# Patient Record
Sex: Female | Born: 1945 | Race: White | Hispanic: No | Marital: Married | Smoking: Never smoker
Health system: Southern US, Community
[De-identification: ages and names within clinical notes are randomized; demographics above are authoritative.]

## PROBLEM LIST (undated history)

## (undated) DIAGNOSIS — C801 Malignant (primary) neoplasm, unspecified: Secondary | ICD-10-CM

## (undated) DIAGNOSIS — I10 Essential (primary) hypertension: Secondary | ICD-10-CM

## (undated) DIAGNOSIS — E119 Type 2 diabetes mellitus without complications: Secondary | ICD-10-CM

## (undated) HISTORY — PX: BACK SURGERY: SHX140

## (undated) HISTORY — PX: HAND SURGERY: SHX662

## (undated) HISTORY — PX: TONSILLECTOMY: SUR1361

## (undated) HISTORY — PX: SHOULDER SURGERY: SHX246

## (undated) HISTORY — PX: BREAST SURGERY: SHX581

## (undated) HISTORY — DX: Essential (primary) hypertension: I10

## (undated) HISTORY — PX: DILATION AND CURETTAGE, DIAGNOSTIC / THERAPEUTIC: SUR384

---

## 2012-02-01 ENCOUNTER — Ambulatory Visit: Payer: Self-pay | Admitting: Internal Medicine

## 2017-07-20 DIAGNOSIS — R0600 Dyspnea, unspecified: Secondary | ICD-10-CM | POA: Insufficient documentation

## 2017-07-20 DIAGNOSIS — R0609 Other forms of dyspnea: Secondary | ICD-10-CM | POA: Insufficient documentation

## 2017-07-20 DIAGNOSIS — R011 Cardiac murmur, unspecified: Secondary | ICD-10-CM | POA: Insufficient documentation

## 2017-07-20 DIAGNOSIS — I10 Essential (primary) hypertension: Secondary | ICD-10-CM | POA: Insufficient documentation

## 2018-03-15 DIAGNOSIS — Z853 Personal history of malignant neoplasm of breast: Secondary | ICD-10-CM | POA: Insufficient documentation

## 2018-06-12 ENCOUNTER — Emergency Department
Admission: EM | Admit: 2018-06-12 | Discharge: 2018-06-12 | Disposition: A | Discharge status: Home / Self Care | Payer: Medicare Other | Attending: Emergency Medicine | Admitting: Emergency Medicine

## 2018-06-12 ENCOUNTER — Encounter: Payer: Self-pay | Admitting: Emergency Medicine

## 2018-06-12 ENCOUNTER — Emergency Department: Payer: Medicare Other

## 2018-06-12 ENCOUNTER — Other Ambulatory Visit: Payer: Self-pay

## 2018-06-12 DIAGNOSIS — S2231XA Fracture of one rib, right side, initial encounter for closed fracture: Secondary | ICD-10-CM | POA: Insufficient documentation

## 2018-06-12 DIAGNOSIS — W01198A Fall on same level from slipping, tripping and stumbling with subsequent striking against other object, initial encounter: Secondary | ICD-10-CM | POA: Diagnosis not present

## 2018-06-12 DIAGNOSIS — Y999 Unspecified external cause status: Secondary | ICD-10-CM | POA: Diagnosis not present

## 2018-06-12 DIAGNOSIS — Y929 Unspecified place or not applicable: Secondary | ICD-10-CM | POA: Diagnosis not present

## 2018-06-12 DIAGNOSIS — Y939 Activity, unspecified: Secondary | ICD-10-CM | POA: Diagnosis not present

## 2018-06-12 DIAGNOSIS — E119 Type 2 diabetes mellitus without complications: Secondary | ICD-10-CM | POA: Diagnosis not present

## 2018-06-12 HISTORY — DX: Malignant (primary) neoplasm, unspecified: C80.1

## 2018-06-12 HISTORY — DX: Type 2 diabetes mellitus without complications: E11.9

## 2018-06-12 MED ORDER — TRAMADOL HCL 50 MG PO TABS: 50.0000 mg | ORAL_TABLET | Freq: Four times a day (QID) | ORAL | 0 refills | Status: AC | PRN

## 2018-06-12 NOTE — ED Provider Notes (Signed)
[facility] Emergency Department Provider Note  ____________________________________________   None    (approximate)  I have reviewed the triage vital signs and the nursing notes.   HISTORY  Chief Complaint Fall   HPI Emily Ortega is a 72 y.o. female  presents  to the ED after falling this morning and landing on a box.  Patient has continued to have right lateral rib pain with deep inspiration.  She denies any head injury or loss of consciousness.  She currently is taking meloxicam for a rotator cuff injury.  She denies any previous injuries to her ribs.  Patient has continued to be ambulatory since her accident.  She rates her pain as a 7 out of 10.   Past Medical History:  Diagnosis Date  . Cancer ([place])   . Diabetes mellitus without complication ([place])     There are no active problems to display for this patient.   Past Surgical History:  Procedure Laterality Date  . BREAST SURGERY    . HAND SURGERY    . SHOULDER SURGERY    . TONSILLECTOMY      Prior to Admission medications   Medication Sig Start Date End Date Taking? Authorizing Provider  traMADol (ULTRAM) 50 MG tablet Take 1 tablet (50 mg total) by mouth every 6 (six) hours as needed. 06/12/18   Johnn Hai, PA-C    Allergies Avelox [moxifloxacin hcl in nacl]  No family history on file.  Social History Social History   Tobacco Use  . Smoking status: Never Smoker  . Smokeless tobacco: Never Used  Substance Use Topics  . Alcohol use: Not on file  . Drug use: Not on file    Review of Systems Constitutional: No fever/chills Eyes: No visual changes. ENT: No trauma. Cardiovascular: Denies chest pain. Respiratory: Denies shortness of breath.  Positive right chest wall pain. Gastrointestinal: No abdominal pain.  No nausea, no vomiting.   Musculoskeletal: Negative for back pain. Skin: Negative for rash. Neurological: Negative for headaches, focal weakness or  numbness. ___________________________________________   PHYSICAL EXAM:  VITAL SIGNS: ED Triage Vitals  Enc Vitals Group     BP 06/12/18 1016 (!) 148/73     Pulse Rate 06/12/18 1016 75     Resp 06/12/18 1016 16     Temp 06/12/18 1016 98.1 F (36.7 C)     Temp Source 06/12/18 1016 Oral     SpO2 06/12/18 1016 98 %     Weight 06/12/18 1002 180 lb (81.6 kg)     Height --      Head Circumference --      Peak Flow --      Pain Score 06/12/18 1002 7     Pain Loc --      Pain Edu? --      Excl. in [place]? --    Constitutional: Alert and oriented. Well appearing and in no acute distress. Eyes: Conjunctivae are normal. PERRL. EOMI. Head: Atraumatic. Nose: No congestion/rhinnorhea. Neck: No stridor.   Cardiovascular: Normal rate, regular rhythm. Grossly normal heart sounds.  Good peripheral circulation. Respiratory: Normal respiratory effort.  No retractions. Lungs CTAB.  On examination of the right ribs there is no gross deformity and no ecchymosis or soft tissue abrasions noted.  It is moderately tender on palpation of the right lateral ribs.  No crepitus is appreciated. Gastrointestinal: Soft and nontender. No distention.  Musculoskeletal: Moves upper and lower extremities without any difficulty. Neurologic:  Normal speech and language.  No gross focal neurologic deficits are appreciated.  Skin:  Skin is warm, dry and intact.  No ecchymosis, erythema or abrasions were noted. Psychiatric: Mood and affect are normal. Speech and behavior are normal.  ____________________________________________   LABS (all labs ordered are listed, but only abnormal results are displayed)  Labs Reviewed - No data to display ____________________________________________  RADIOLOGY  Official radiology report(s): Dg Ribs Unilateral W/chest Right  Result Date: 06/12/2018 CLINICAL DATA:  Recent fall with right-sided chest pain, initial encounter EXAM: RIGHT RIBS AND CHEST - 3+ VIEW COMPARISON:  02/01/2012  FINDINGS: Cardiac shadows within normal limits. The lungs are well aerated bilaterally. No focal infiltrate, effusion or pneumothorax is seen. Calcified granuloma in the left lung base is noted. Mild irregularity is noted along the anterior aspect of the right fifth rib which may represent an undisplaced fracture. No other definitive fractures are seen. IMPRESSION: Changes suspicious for undisplaced fracture of the right fifth rib. Electronically Signed   By: Inez Catalina M.D.   On: 06/12/2018 11:57    ____________________________________________   PROCEDURES  Procedure(s) performed: None  Procedures  Critical Care performed: No  ____________________________________________   INITIAL IMPRESSION / ASSESSMENT AND PLAN / ED COURSE  As part of my medical decision making, I reviewed the following data within the electronic MEDICAL RECORD NUMBER Notes from prior ED visits and [place] Controlled Substance Database  Patient presents to the ED after falling this morning and hitting her right ribs on a box that she was carrying.  Patient denies any head injury or loss of consciousness.  She denies any other injuries.  Since her fall she is continued to have pain in her right rib and is exquisitely tender to palpation.  No soft tissue injury is noted.  X-ray confirms that she does have a 5th nondisplaced fracture on the right.  Patient was made aware.  We discussed pain medication.  She will take tramadol for moderate to severe pain.  Currently she is taking meloxicam for a rotator cuff injury and will continue doing so.  She is to follow-up with her PCP if any continued problems or additional pain medication.  ____________________________________________   FINAL CLINICAL IMPRESSION(S) / ED DIAGNOSES  Final diagnoses:  Traumatic closed nondisplaced fracture of one rib, right, initial encounter     ED Discharge Orders         Ordered    traMADol (ULTRAM) 50 MG tablet  Every 6 hours PRN     06/12/18  1237           Note:  This document was prepared using Dragon voice recognition software and may include unintentional dictation errors.    Johnn Hai, PA-C 06/12/18 1246    Earleen Newport, MD 06/12/18 [phone]

## 2018-06-12 NOTE — Discharge Instructions (Addendum)
Follow-up with your primary care provider if any continued problems.  Rib fractures take about 4 to 6 weeks to heal.  Continue to take deep breaths to prevent pneumonia.  You may place a pillow over your rib to help support it if you need to cough.  Continue taking meloxicam for inflammation.  Tylenol as needed for pain.  Tramadol is for moderate pain.  This medication could cause drowsiness.  This tablet is 1 every 6 hours as needed for pain.  Follow-up with your primary care provider if any continued pain medication as needed.

## 2018-06-12 NOTE — ED Notes (Signed)
Pt had mechanical fall this am and fell onto cement landing on RT side, c/o RT rib cage area. PT holding side. Denies any LOC or head injury with fall

## 2018-07-02 ENCOUNTER — Encounter: Payer: Self-pay | Admitting: Family Medicine

## 2018-07-02 ENCOUNTER — Ambulatory Visit (INDEPENDENT_AMBULATORY_CARE_PROVIDER_SITE_OTHER): Payer: Medicare Other | Admitting: Family Medicine

## 2018-07-02 VITALS — BP 154/80 | HR 93 | Ht 64.0 in | Wt 182.0 lb

## 2018-07-02 DIAGNOSIS — Z01419 Encounter for gynecological examination (general) (routine) without abnormal findings: Secondary | ICD-10-CM | POA: Diagnosis not present

## 2018-07-02 NOTE — Progress Notes (Signed)
Had mammo this year and was normal Last Pap was last year and was normal

## 2018-07-02 NOTE — Progress Notes (Signed)
  Subjective:     Emily Ortega is a 72 y.o. female and is here for a comprehensive physical exam. The patient reports no problems. 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 Chemo lead to menopause at age 77. No bleeding now. Retired but keeping her grandkids now. Mother of Emily Ortega. 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 Recently moved here from [place]. Remarried.  The following portions of the patient's history were reviewed and updated as appropriate: allergies, current medications, past family history, past medical history, past social history, past surgical history and problem list.  Review of Systems Pertinent items noted in HPI and remainder of comprehensive ROS otherwise negative.   Objective:    BP (!) 154/80   Pulse 93   Ht 5\' 4"  (1.626 m)   Wt 182 lb (82.6 kg)   BMI 31.24 kg/m  General appearance: alert, cooperative and appears stated age Head: Normocephalic, without obvious abnormality, atraumatic Neck: no adenopathy, supple, symmetrical, trachea midline and thyroid not enlarged, symmetric, no tenderness/mass/nodules Lungs: clear to auscultation bilaterally Breasts: normal appearance, no masses or tenderness Heart: regular rate and rhythm, S1, S2 normal, no murmur, click, rub or gallop Abdomen: soft, non-tender; bowel sounds normal; no masses,  no organomegaly Pelvic: cervix normal in appearance, external genitalia normal, no adnexal masses or tenderness, no cervical motion tenderness, uterus normal size, shape, and consistency and vaginal atrophy Extremities: Homans sign is negative, no sign of DVT Pulses: 2+ and symmetric Skin: Skin color, texture, turgor normal. No rashes or lesions Lymph nodes: Cervical, supraclavicular, and axillary nodes normal. Neurologic: Grossly normal    Assessment:    GYN female exam.      Plan:   Problem List Items Addressed This Visit    None    Visit Diagnoses    Encounter for gynecological examination without  abnormal finding    -  Primary     No need for further pap smears, given age. Likes for GYN to do her pelvic and breast check--last mammogram is normal.  Needs PCP-->given [place] info.  Return in 1 year (on 07/03/2019).    See After Visit Summary for Counseling Recommendations

## 2018-07-02 NOTE — Patient Instructions (Signed)
Preventive Care 70 Years and Older, Female Preventive care refers to lifestyle choices and visits with your health care provider that can promote health and wellness. What does preventive care include?  A yearly physical exam. This is also called an annual well check.  Dental exams once or twice a year.  Routine eye exams. Ask your health care provider how often you should have your eyes checked.  Personal lifestyle choices, including: ? Daily care of your teeth and gums. ? Regular physical activity. ? Eating a healthy diet. ? Avoiding tobacco and drug use. ? Limiting alcohol use. ? Practicing safe sex. ? Taking low-dose aspirin every day. ? Taking vitamin and mineral supplements as recommended by your health care provider. What happens during an annual well check? The services and screenings done by your health care provider during your annual well check will depend on your age, overall health, lifestyle risk factors, and family history of disease. Counseling Your health care provider may ask you questions about your:  Alcohol use.  Tobacco use.  Drug use.  Emotional well-being.  Home and relationship well-being.  Sexual activity.  Eating habits.  History of falls.  Memory and ability to understand (cognition).  Work and work Statistician.  Reproductive health.  Screening You may have the following tests or measurements:  Height, weight, and BMI.  Blood pressure.  Lipid and cholesterol levels. These may be checked every 5 years, or more frequently if you are over 70 years old.  Skin check.  Lung cancer screening. You may have this screening every year starting at age 70 if you have a 30-pack-year history of smoking and currently smoke or have quit within the past 15 years.  Colorectal cancer screening. All adults should have this screening starting at age 70 and continuing until age 70. You will have tests every 1-10 years, depending on your results and the  type of screening test. People at increased risk should start screening at an earlier age. Screening tests may include: ? Guaiac-based fecal occult blood testing. ? Fecal immunochemical test (FIT). ? Stool DNA test. ? Virtual colonoscopy. ? Sigmoidoscopy. During this test, a flexible tube with a tiny camera (sigmoidoscope) is used to examine your rectum and lower colon. The sigmoidoscope is inserted through your anus into your rectum and lower colon. ? Colonoscopy. During this test, a long, thin, flexible tube with a tiny camera (colonoscope) is used to examine your entire colon and rectum.  Hepatitis C blood test.  Hepatitis B blood test.  Sexually transmitted disease (STD) testing.  Diabetes screening. This is done by checking your blood sugar (glucose) after you have not eaten for a while (fasting). You may have this done every 1-3 years.  Bone density scan. This is done to screen for osteoporosis. You may have this done starting at age 70.  Mammogram. This may be done every 1-2 years. Talk to your health care provider about how often you should have regular mammograms. Talk with your health care provider about your test results, treatment options, and if necessary, the need for more tests. Vaccines Your health care provider may recommend certain vaccines, such as:  Influenza vaccine. This is recommended every year.  Tetanus, diphtheria, and acellular pertussis (Tdap, Td) vaccine. You may need a Td booster every 10 years.  Varicella vaccine. You may need this if you have not been vaccinated.  Zoster vaccine. You may need this after age 70.  Measles, mumps, and rubella (MMR) vaccine. You may need at least  one dose of MMR if you were born in 1957 or later. You may also need a second dose.  Pneumococcal 13-valent conjugate (PCV13) vaccine. One dose is recommended after age 70.  Pneumococcal polysaccharide (PPSV23) vaccine. One dose is recommended after age 70.  Meningococcal  vaccine. You may need this if you have certain conditions.  Hepatitis A vaccine. You may need this if you have certain conditions or if you travel or work in places where you may be exposed to hepatitis A.  Hepatitis B vaccine. You may need this if you have certain conditions or if you travel or work in places where you may be exposed to hepatitis B.  Haemophilus influenzae type b (Hib) vaccine. You may need this if you have certain conditions. Talk to your health care provider about which screenings and vaccines you need and how often you need them. This information is not intended to replace advice given to you by your health care provider. Make sure you discuss any questions you have with your health care provider. Document Released: 07/16/2015 Document Revised: 08/09/2017 Document Reviewed: 04/20/2015 Elsevier Interactive Patient Education  2019 [employer].

## 2018-07-04 ENCOUNTER — Encounter: Payer: Self-pay | Admitting: Family Medicine

## 2018-07-04 DIAGNOSIS — E7800 Pure hypercholesterolemia, unspecified: Secondary | ICD-10-CM | POA: Insufficient documentation

## 2018-07-04 DIAGNOSIS — E119 Type 2 diabetes mellitus without complications: Secondary | ICD-10-CM | POA: Insufficient documentation

## 2018-07-04 DIAGNOSIS — M8580 Other specified disorders of bone density and structure, unspecified site: Secondary | ICD-10-CM | POA: Insufficient documentation

## 2018-07-04 DIAGNOSIS — G4733 Obstructive sleep apnea (adult) (pediatric): Secondary | ICD-10-CM | POA: Insufficient documentation

## 2018-07-04 DIAGNOSIS — E559 Vitamin D deficiency, unspecified: Secondary | ICD-10-CM | POA: Insufficient documentation

## 2018-07-04 DIAGNOSIS — F419 Anxiety disorder, unspecified: Secondary | ICD-10-CM | POA: Insufficient documentation

## 2018-07-04 DIAGNOSIS — F325 Major depressive disorder, single episode, in full remission: Secondary | ICD-10-CM | POA: Insufficient documentation

## 2018-07-04 DIAGNOSIS — K219 Gastro-esophageal reflux disease without esophagitis: Secondary | ICD-10-CM | POA: Insufficient documentation

## 2018-07-08 ENCOUNTER — Encounter: Payer: Self-pay | Admitting: Radiology

## 2019-06-11 ENCOUNTER — Encounter: Payer: Self-pay | Admitting: Radiology

## 2020-04-30 ENCOUNTER — Emergency Department: Payer: Medicare Other

## 2020-04-30 ENCOUNTER — Encounter: Payer: Self-pay | Admitting: Emergency Medicine

## 2020-04-30 ENCOUNTER — Other Ambulatory Visit: Payer: Self-pay

## 2020-04-30 ENCOUNTER — Emergency Department
Admission: EM | Admit: 2020-04-30 | Discharge: 2020-04-30 | Disposition: A | Discharge status: Home / Self Care | Payer: Medicare Other | Attending: Emergency Medicine | Admitting: Emergency Medicine

## 2020-04-30 DIAGNOSIS — Z79899 Other long term (current) drug therapy: Secondary | ICD-10-CM | POA: Insufficient documentation

## 2020-04-30 DIAGNOSIS — E119 Type 2 diabetes mellitus without complications: Secondary | ICD-10-CM | POA: Diagnosis not present

## 2020-04-30 DIAGNOSIS — Z7984 Long term (current) use of oral hypoglycemic drugs: Secondary | ICD-10-CM | POA: Insufficient documentation

## 2020-04-30 DIAGNOSIS — R1011 Right upper quadrant pain: Secondary | ICD-10-CM | POA: Diagnosis not present

## 2020-04-30 DIAGNOSIS — Z859 Personal history of malignant neoplasm, unspecified: Secondary | ICD-10-CM | POA: Diagnosis not present

## 2020-04-30 DIAGNOSIS — R1010 Upper abdominal pain, unspecified: Secondary | ICD-10-CM

## 2020-04-30 DIAGNOSIS — K219 Gastro-esophageal reflux disease without esophagitis: Secondary | ICD-10-CM | POA: Diagnosis not present

## 2020-04-30 DIAGNOSIS — I10 Essential (primary) hypertension: Secondary | ICD-10-CM | POA: Diagnosis not present

## 2020-04-30 LAB — URINALYSIS, COMPLETE (UACMP) WITH MICROSCOPIC
Bacteria, UA: NONE SEEN
Bilirubin Urine: NEGATIVE
Glucose, UA: NEGATIVE mg/dL
Hgb urine dipstick: NEGATIVE
Ketones, ur: 5 mg/dL — AB
Nitrite: NEGATIVE
Protein, ur: NEGATIVE mg/dL
Specific Gravity, Urine: 1.031 — ABNORMAL HIGH (ref 1.005–1.030)
pH: 5 (ref 5.0–8.0)

## 2020-04-30 LAB — COMPREHENSIVE METABOLIC PANEL
ALT: 32 U/L (ref 0–44)
AST: 30 U/L (ref 15–41)
Albumin: 4.3 g/dL (ref 3.5–5.0)
Alkaline Phosphatase: 90 U/L (ref 38–126)
Anion gap: 11 (ref 5–15)
BUN: 20 mg/dL (ref 8–23)
CO2: 22 mmol/L (ref 22–32)
Calcium: 9.8 mg/dL (ref 8.9–10.3)
Chloride: 102 mmol/L (ref 98–111)
Creatinine, Ser: 0.91 mg/dL (ref 0.44–1.00)
GFR, Estimated: >60 mL/min (ref >60)
Glucose, Bld: 181 mg/dL — ABNORMAL HIGH (ref 70–99)
Potassium: 4.6 mmol/L (ref 3.5–5.1)
Sodium: 135 mmol/L (ref 135–145)
Total Bilirubin: 0.5 mg/dL (ref 0.3–1.2)
Total Protein: 7.6 g/dL (ref 6.5–8.1)

## 2020-04-30 LAB — CBC
HCT: 37.6 % (ref 36.0–46.0)
Hemoglobin: 12.5 g/dL (ref 12.0–15.0)
MCH: 28.4 pg (ref 26.0–34.0)
MCHC: 33.2 g/dL (ref 30.0–36.0)
MCV: 85.5 fL (ref 80.0–100.0)
Platelets: 238 10*3/uL (ref 150–400)
RBC: 4.4 MIL/uL (ref 3.87–5.11)
RDW: 13.7 % (ref 11.5–15.5)
WBC: 10.3 10*3/uL (ref 4.0–10.5)
nRBC: 0 % (ref 0.0–0.2)

## 2020-04-30 LAB — LIPASE, BLOOD: Lipase: 28 U/L (ref 11–51)

## 2020-04-30 MED ORDER — KETOROLAC TROMETHAMINE 30 MG/ML IJ SOLN
30.0000 mg | Freq: Once | INTRAMUSCULAR | Status: AC
  Administered 2020-04-30: 30 mg via INTRAMUSCULAR
  Filled 2020-04-30: qty 1

## 2020-04-30 MED ORDER — NAPROXEN 500 MG PO TABS: 500.0000 mg | ORAL_TABLET | Freq: Two times a day (BID) | ORAL | 2 refills | Status: AC

## 2020-04-30 MED ORDER — IOHEXOL 300 MG/ML  SOLN
100.0000 mL | Freq: Once | INTRAMUSCULAR | Status: AC | PRN
  Administered 2020-04-30: 100 mL via INTRAVENOUS
  Filled 2020-04-30: qty 100

## 2020-04-30 NOTE — ED Triage Notes (Signed)
Pt to ED via POV c/o RUQ abdominal pain for several months. Pt states that she came to the ED today because the pain is "severe". Pt is resting comfortably, in NAD at this time. Pt denies any other symptoms at this time.

## 2020-04-30 NOTE — ED Provider Notes (Signed)
[facility] Emergency Department Provider Note   ____________________________________________    I have reviewed the triage vital signs and the nursing notes.   HISTORY  Chief Complaint Abdominal Pain     HPI Emily Ortega is a 74 y.o. female with history of diabetes, hypertension who presents with complaints of right upper quadrant abdominal pain.  Patient reports on and off pain in that area for nearly 2 months, does not seem to be related to eating but she is not entirely certain.  Has taken p.o. medications with little relief.  She reports pain worsened significantly overnight.  She did receive her Covid booster yesterday so reports she is feeling fatigued with some chills.  Past Medical History:  Diagnosis Date  . Cancer ([place])   . Diabetes mellitus without complication ([place])   . Hypertension     Patient Active Problem List   Diagnosis Date Noted  . Chronic anxiety 07/04/2018  . GERD (gastroesophageal reflux disease) 07/04/2018  . Hypercholesteremia 07/04/2018  . Hypomagnesemia 07/04/2018  . Major depressive disorder with single episode, in remission ([place]) 07/04/2018  . OSA (obstructive sleep apnea) 07/04/2018  . Osteopenia 07/04/2018  . Type 2 diabetes mellitus without complication, without long-term current use of insulin ([place]) 07/04/2018  . Vitamin D insufficiency 07/04/2018  . Personal history of breast cancer 03/15/2018  . DOE (dyspnea on exertion) 07/20/2017  . Essential (primary) hypertension 07/20/2017  . Murmur 07/20/2017    Past Surgical History:  Procedure Laterality Date  . BACK SURGERY    . BREAST SURGERY    . DILATION AND CURETTAGE, DIAGNOSTIC / THERAPEUTIC    . HAND SURGERY    . SHOULDER SURGERY    . TONSILLECTOMY      Prior to Admission medications   Medication Sig Start Date End Date Taking? Authorizing Provider  cholecalciferol (VITAMIN D3) 10 MCG (400 UNIT) TABS tablet Take 2,000 Units by mouth.     [provider]  esomeprazole (NEXIUM) 40 MG capsule Take 40 mg by mouth daily at 12 noon.    [provider]  glipiZIDE (GLUCOTROL) 10 MG tablet Take 10 mg by mouth daily before breakfast.    [provider]  hydrochlorothiazide (MICROZIDE) 12.5 MG capsule Take 12.5 mg by mouth daily.    [provider]  Loratadine (CLARITIN) 10 MG CAPS Take 1 capsule by mouth daily as needed.    [provider]  metFORMIN (GLUCOPHAGE-XR) 500 MG 24 hr tablet Take 500 mg by mouth daily with breakfast.    [provider]  naproxen (NAPROSYN) 500 MG tablet Take 1 tablet (500 mg total) by mouth 2 (two) times daily with a meal. 04/30/20   Lavonia Drafts, MD  sertraline (ZOLOFT) 50 MG tablet Take 50 mg by mouth daily.    [provider]  simvastatin (ZOCOR) 40 MG tablet Take 40 mg by mouth daily.    [provider]  telmisartan (MICARDIS) 80 MG tablet Take 80 mg by mouth daily.    [provider]  traMADol (ULTRAM) 50 MG tablet Take 1 tablet (50 mg total) by mouth every 6 (six) hours as needed. 06/12/18   Johnn Hai, PA-C     Allergies Amoxicillin-pot clavulanate, Avelox [moxifloxacin hcl in nacl], Erythromycin, and Other  Family History  Problem Relation Age of Onset  . Breast cancer Mother 37  . Heart disease Maternal Grandmother   . Heart disease Brother   . Diabetes Brother   . Heart disease Sister   .  Diabetes Sister     Social History Social History   Tobacco Use  . Smoking status: Never Smoker  . Smokeless tobacco: Never Used  Vaping Use  . Vaping Use: Never used  Substance Use Topics  . Alcohol use: Not Currently  . Drug use: Never    Review of Systems  Constitutional: No fever/chills Eyes: No visual changes.  ENT: No sore throat. Cardiovascular: Denies chest pain. Respiratory: Denies shortness of breath.  No pleurisy Gastrointestinal: As above Genitourinary: Negative for  dysuria. Musculoskeletal: Negative for back pain. Skin: Negative for rash. Neurological: Negative for headaches    ____________________________________________   PHYSICAL EXAM:  VITAL SIGNS: ED Triage Vitals  Enc Vitals Group     BP 04/30/20 0818 (!) 144/78     Pulse Rate 04/30/20 0818 89     Resp 04/30/20 0818 16     Temp 04/30/20 0818 99.8 F (37.7 C)     Temp Source 04/30/20 0818 Oral     SpO2 04/30/20 0818 95 %     Weight 04/30/20 0817 77.1 kg (170 lb)     Height 04/30/20 0817 1.626 m (5\' 4" )     Head Circumference --      Peak Flow --      Pain Score 04/30/20 0817 8     Pain Loc --      Pain Edu? --      Excl. in [place]? --     Constitutional: Alert and oriented. No acute distress.  Nose: No congestion/rhinnorhea. Mouth/Throat: Mucous membranes are moist.    Cardiovascular: Normal rate, regular rhythm.   Good peripheral circulation.  No chest wall tenderness palpation Respiratory: Normal respiratory effort.  No retractions.  Gastrointestinal: Tenderness in the right upper quadrant, soft. No distention.  No CVA tenderness.  Musculoskeletal:   Warm and well perfused Neurologic:  Normal speech and language. No gross focal neurologic deficits are appreciated.  Skin:  Skin is warm, dry and intact. No rash noted. Psychiatric: Mood and affect are normal. Speech and behavior are normal.  ____________________________________________   LABS (all labs ordered are listed, but only abnormal results are displayed)  Labs Reviewed  COMPREHENSIVE METABOLIC PANEL - Abnormal; Notable for the following components:      Result Value   Glucose, Bld 181 (*)    All other components within normal limits  URINALYSIS, COMPLETE (UACMP) WITH MICROSCOPIC - Abnormal; Notable for the following components:   Color, Urine YELLOW (*)    APPearance HAZY (*)    Specific Gravity, Urine 1.031 (*)    Ketones, ur 5 (*)    Leukocytes,Ua SMALL (*)    All other components within normal limits   LIPASE, BLOOD  CBC   ____________________________________________  EKG  None ____________________________________________  RADIOLOGY  Ultrasound right upper quadrant, reviewed by me, no abnormality noted, pending radiology review CT reviewed by me, no normalities noted, pending radiology review ____________________________________________   PROCEDURES  Procedure(s) performed: No  Procedures   Critical Care performed: No ____________________________________________   INITIAL IMPRESSION / ASSESSMENT AND PLAN / ED COURSE  Pertinent labs & imaging results that were available during my care of the patient were reviewed by me and considered in my medical decision making (see chart for details).  Patient presents with right upper quadrant abdominal pain, she has significant tenderness in this area.  Suspicious for cholelithiasis/cholecystitis.  Differential also includes gastritis, no chest pain to suggest PE.  US demonstrates unremarkable gallbladder, will proceed with CT abd/pel  Patient feeling  better after IM toradol.  CT abdomen pelvis is unremarkable  Patient continues to feel improved, appropriate for discharge at this time with outpatient follow-up with her PCP for further evaluation of this pain, possibly musculoskeletal, will Rx naproxen    ____________________________________________   FINAL CLINICAL IMPRESSION(S) / ED DIAGNOSES  Final diagnoses:  Upper abdominal pain        Note:  This document was prepared using Dragon voice recognition software and may include unintentional dictation errors.   Lavonia Drafts, MD 04/30/20 1520

## 2022-10-01 IMAGING — CT CT ABD-PELV W/ CM
2 of 5 series · 16 of 46 positions shown, 18 images · IV contrast (APPLIED)
Comparison: None.

CLINICAL DATA: Right upper quadrant pain

EXAM:
CT ABDOMEN AND PELVIS WITH CONTRAST
TECHNIQUE: Multidetector CT imaging of the abdomen and pelvis was performed
using the standard protocol following bolus administration of
intravenous contrast.
CONTRAST:  100mL OMNIPAQUE IOHEXOL 300 MG/ML  SOLN

[Series 2: routine abd/pel with · axial · 0.85mm/px · z∈[-487,-27]mm · 13 of 104 slices shown, 15 images]
[im 6/104  soft-tissue]
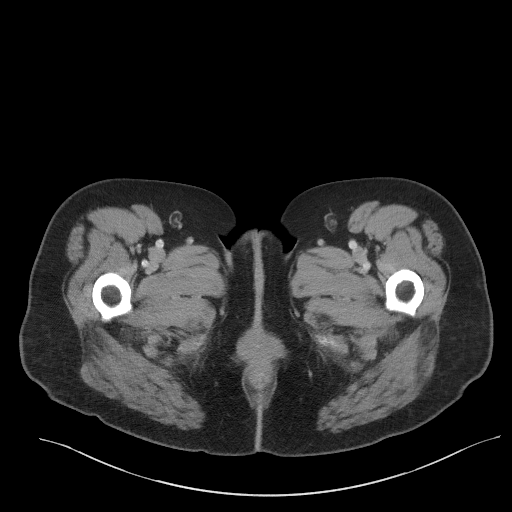
[im 6/104  bone]
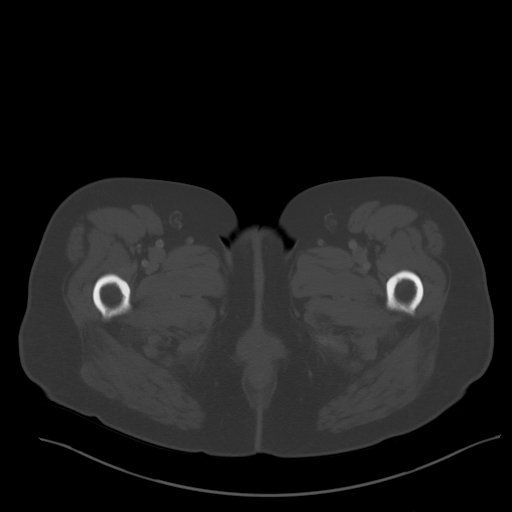
[im 12/104  soft-tissue]
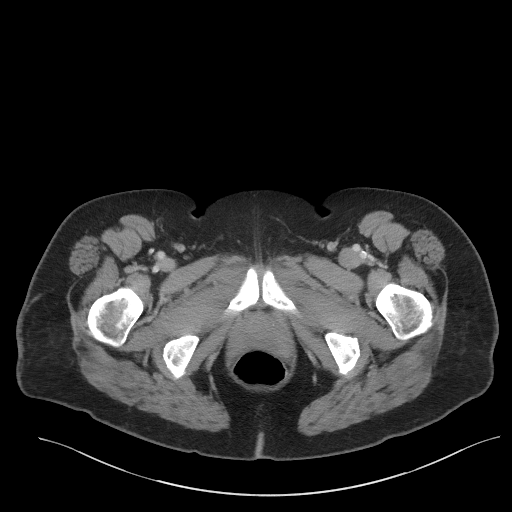
[im 23/104  soft-tissue]
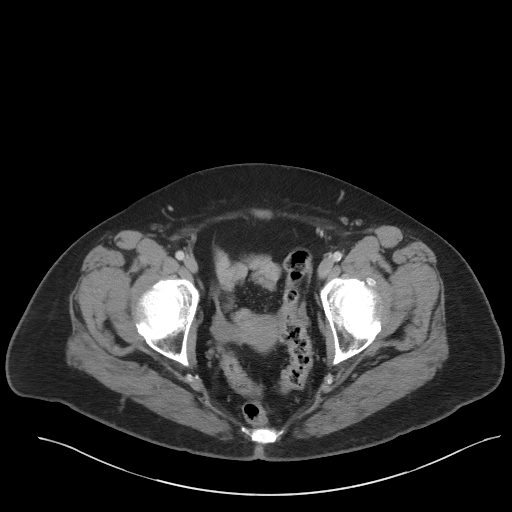
[im 29/104  soft-tissue]
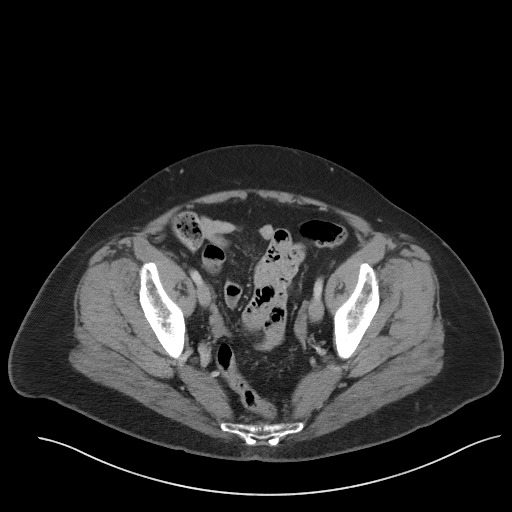
[im 35/104  soft-tissue]
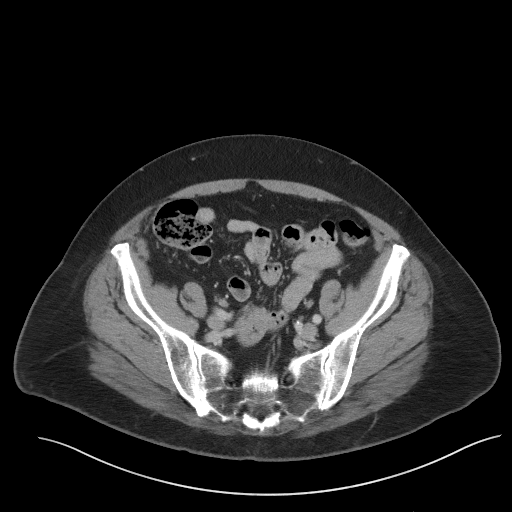
[im 46/104  soft-tissue]
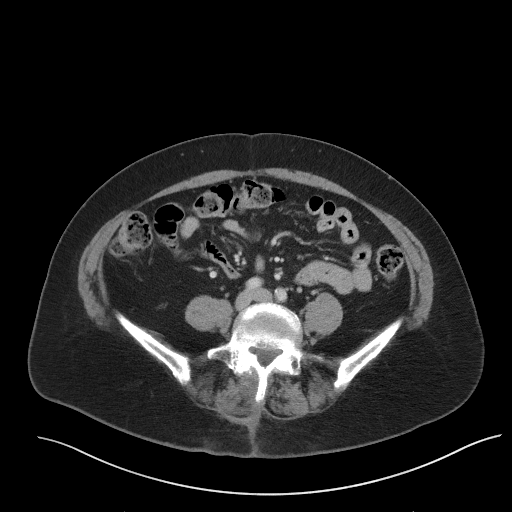
[im 52/104  soft-tissue]
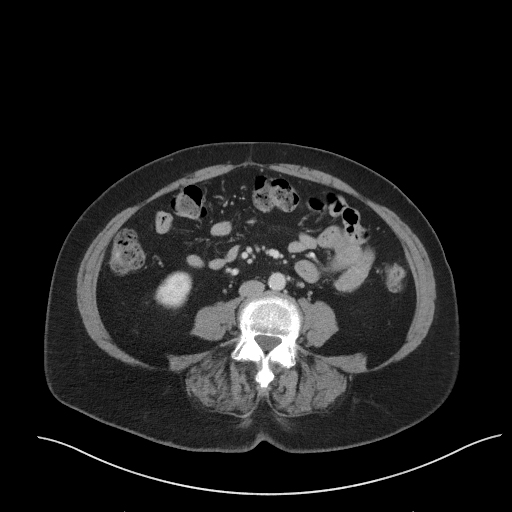
[im 58/104  soft-tissue]
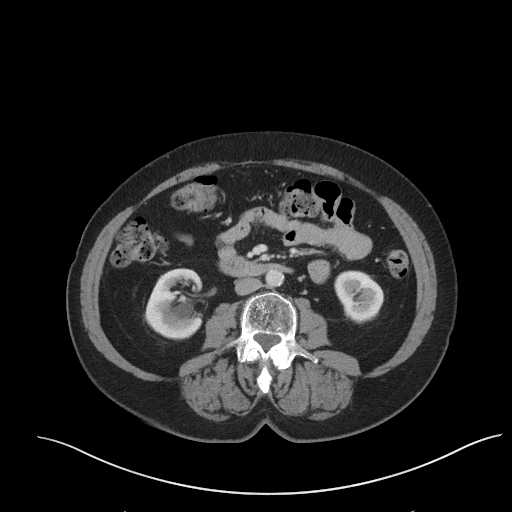
[im 69/104  soft-tissue]
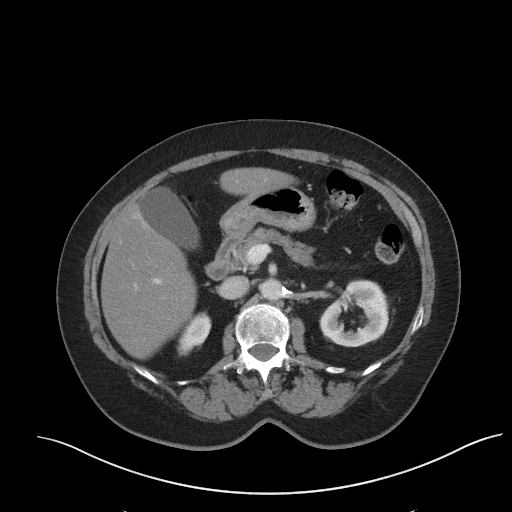
[im 69/104  bone]
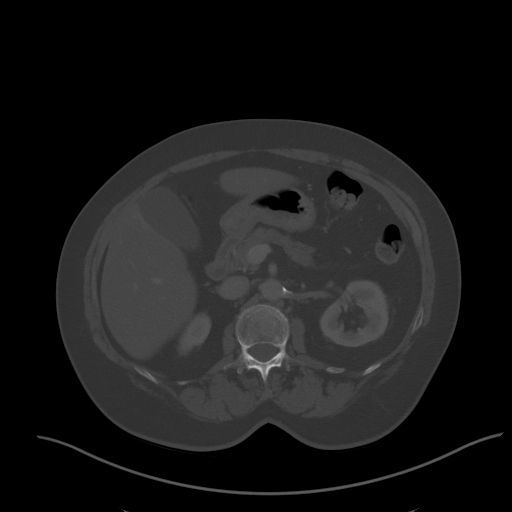
[im 75/104  soft-tissue]
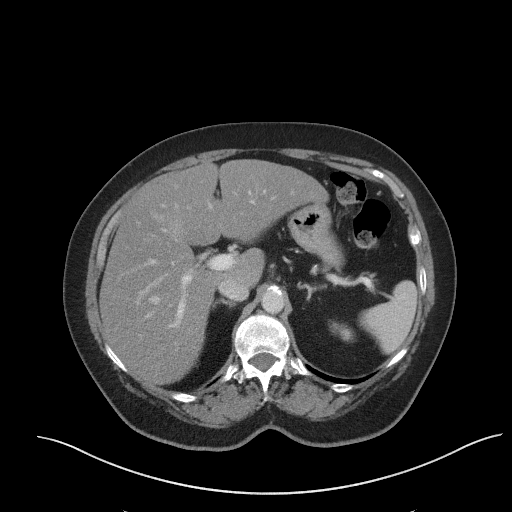
[im 81/104  soft-tissue]
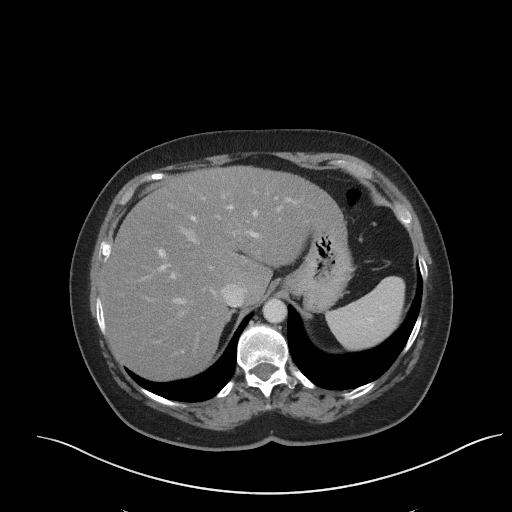
[im 92/104  soft-tissue]
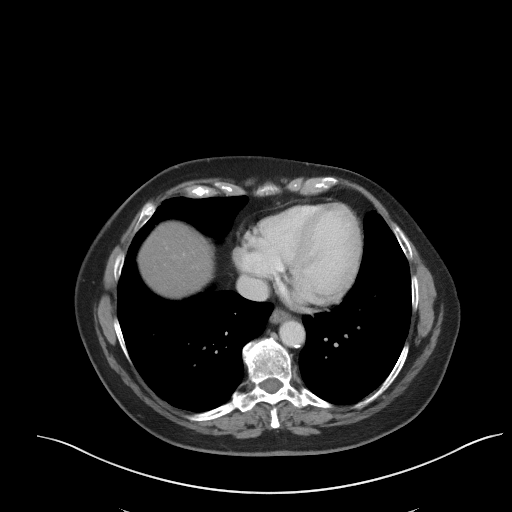
[im 98/104  soft-tissue]
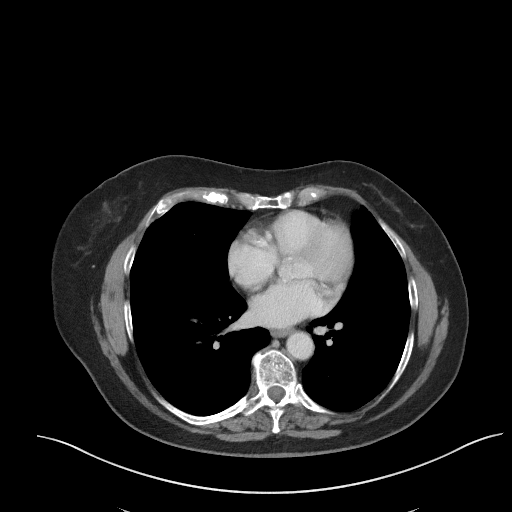

[Series 5: coronal st · coronal · 0.75mm/px · 3 of 98 slices shown]
[im 33/98  soft-tissue]
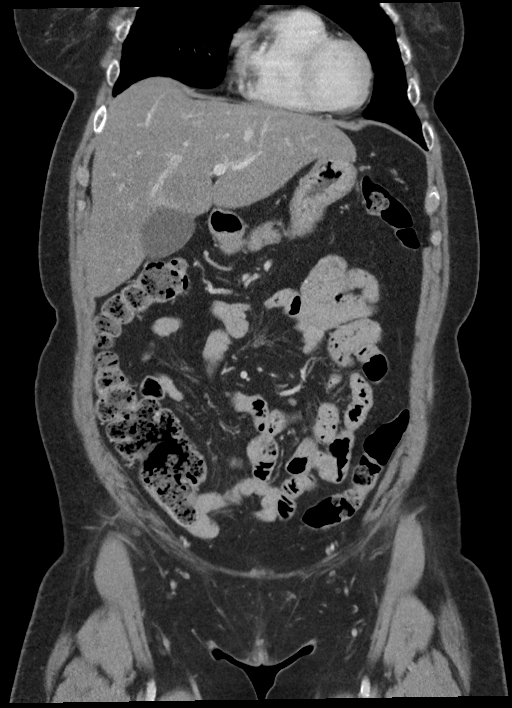
[im 44/98  soft-tissue]
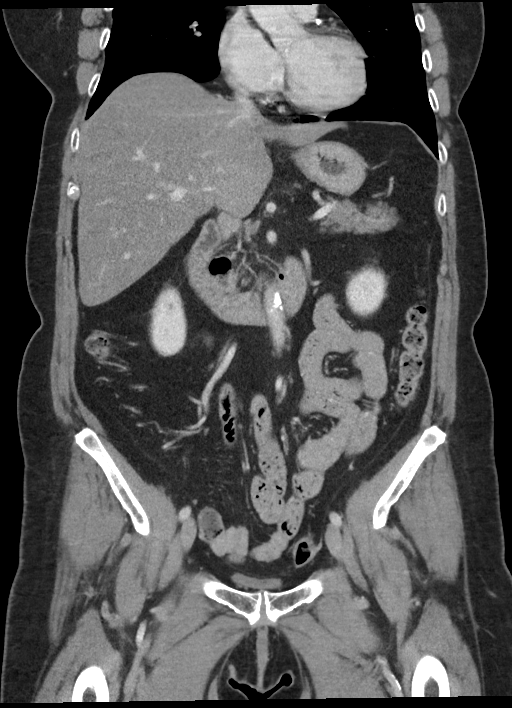
[im 54/98  soft-tissue]
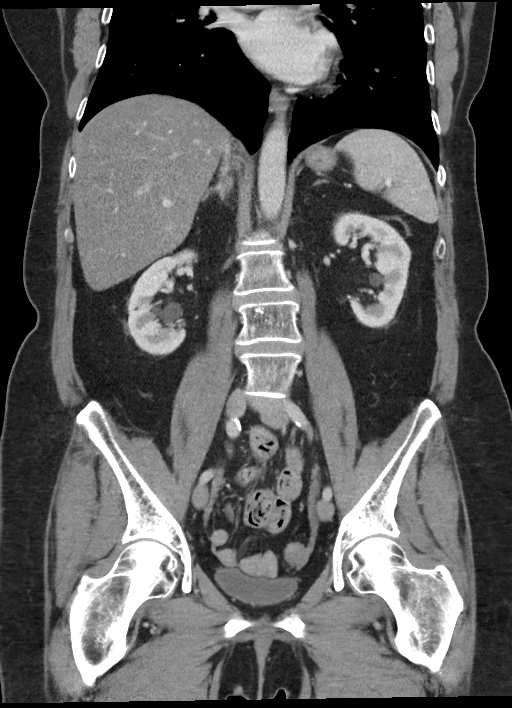

[16 of 46 positions shown; findings below may reference images not displayed]

FINDINGS: Lower chest: No acute abnormality.

Hepatobiliary: No focal liver abnormality is seen. No gallstones,
gallbladder wall thickening, or biliary dilatation.

Pancreas: Unremarkable.

Spleen: Unremarkable.

Adrenals/Urinary Tract: Adrenals are unremarkable. Kidneys are
unremarkable. Punctate calcification within anterior bladder lumen
or adjacent wall.

Stomach/Bowel: Stomach is within normal limits. Bowel is normal in
caliber.

Vascular/Lymphatic: Aortic atherosclerosis. No enlarged lymph nodes
identified. Normal appendix.

Reproductive: Uterus and bilateral adnexa are unremarkable.

Other: No ascites.  Abdominal wall is unremarkable.

Musculoskeletal: Probable postoperative changes at L5-S1.
Degenerative changes of the included spine. No acute osseous
abnormality.
IMPRESSION: No findings to account for reported symptoms.

## 2023-03-29 ENCOUNTER — Ambulatory Visit
Admission: RE | Admit: 2023-03-29 | Discharge: 2023-03-29 | Disposition: A | Discharge status: Home / Self Care | Payer: Medicare Other | Source: Ambulatory Visit | Attending: Emergency Medicine | Admitting: Emergency Medicine

## 2023-03-29 VITALS — BP 138/79 | HR 75 | Temp 98.1°F | Resp 16 | Wt 149.0 lb

## 2023-03-29 DIAGNOSIS — J0100 Acute maxillary sinusitis, unspecified: Secondary | ICD-10-CM | POA: Diagnosis not present

## 2023-03-29 MED ORDER — AMOXICILLIN 875 MG PO TABS: 875.0000 mg | ORAL_TABLET | Freq: Two times a day (BID) | ORAL | 0 refills | Status: DC

## 2023-03-29 NOTE — Discharge Instructions (Addendum)
Take the amoxicillin as directed.  Follow up with your primary care provider if your symptoms are not improving.   ° ° °

## 2023-03-29 NOTE — ED Provider Notes (Signed)
Emily Ortega    CSN: [id number] Arrival date & time: 03/29/23  1247      History   Chief Complaint Chief Complaint  Patient presents with   Nasal Congestion    Pretty sure it's a sinus infection - Entered by patient   Cough    HPI Emily Ortega is a 77 y.o. female.  Patient presents with 10-day history of congestion, sinus pressure, cough.  No fever, chest pain, shortness of breath, or other symptoms.  Treating with OTC cold and sinus medication.  Her medical history includes hypertension, diabetes, hyperlipidemia, breast cancer.  The history is provided by the patient and medical records.    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   OSA (obstructive sleep apnea) 07/04/2018   Osteopenia 07/04/2018   Type 2 diabetes mellitus without complication, without long-term current use of insulin (HCC) 07/04/2018   Vitamin D insufficiency 07/04/2018   Personal history of breast cancer 03/15/2018   DOE (dyspnea on exertion) 07/20/2017   Essential (primary) hypertension 07/20/2017   Murmur 07/20/2017    Past Surgical History:  Procedure Laterality Date   BACK SURGERY     BREAST SURGERY     DILATION AND CURETTAGE, DIAGNOSTIC / THERAPEUTIC     HAND SURGERY     SHOULDER SURGERY     TONSILLECTOMY      OB History     Gravida  2   Para  2   Term  2   Preterm      AB      Living  1      SAB      IAB      Ectopic      Multiple      Live Births  1        Obstetric Comments  Died at age 58 of pneumonia          Home Medications    Prior to Admission medications   Medication Sig Start  Date End Date Taking? Authorizing Provider  amoxicillin (AMOXIL) 875 MG tablet Take 1 tablet (875 mg total) by mouth 2 (two) times daily for 10 days. 03/29/23 04/08/23 Yes Mickie Bail, NP  cholecalciferol (VITAMIN D3) 10 MCG (400 UNIT) TABS tablet Take 2,000 Units by mouth.    [provider]  esomeprazole (NEXIUM) 40 MG capsule Take 40 mg by mouth daily at 12 noon.    [provider]  glipiZIDE (GLUCOTROL) 10 MG tablet Take 10 mg by mouth daily before breakfast.    [provider]  hydrochlorothiazide (MICROZIDE) 12.5 MG capsule Take 12.5 mg by mouth daily.    [provider]  Loratadine (CLARITIN) 10 MG CAPS Take 1 capsule by mouth daily as needed.    [provider]  metFORMIN (GLUCOPHAGE-XR) 500 MG 24 hr tablet Take 500 mg by mouth daily with breakfast.    [provider]  naproxen (NAPROSYN) 500 MG tablet Take 1 tablet (500 mg total) by mouth 2 (two) times daily with a meal. 04/30/20   Jene Every, MD  sertraline (ZOLOFT) 50 MG tablet Take 50 mg by mouth daily.  [provider]  simvastatin (ZOCOR) 40 MG tablet Take 40 mg by mouth daily.    [provider]  telmisartan (MICARDIS) 80 MG tablet Take 80 mg by mouth daily.    [provider]  traMADol (ULTRAM) 50 MG tablet Take 1 tablet (50 mg total) by mouth every 6 (six) hours as needed. 06/12/18   Tommi Rumps, PA-C    Family History Family History  Problem Relation Age of Onset   Breast cancer Mother 77   Heart disease Maternal Grandmother    Heart disease Brother    Diabetes Brother    Heart disease Sister    Diabetes Sister     Social History Social History   Tobacco Use   Smoking status: Never   Smokeless tobacco: Never  Vaping Use   Vaping status: Never Used  Substance Use Topics   Alcohol use: Not Currently   Drug use: Never     Allergies   Amoxicillin-pot clavulanate, Avelox [moxifloxacin hcl in nacl], Erythromycin, and  Other   Review of Systems Review of Systems  Constitutional:  Negative for chills and fever.  HENT:  Positive for congestion, postnasal drip, rhinorrhea and sinus pressure. Negative for ear pain and sore throat.   Respiratory:  Positive for cough. Negative for shortness of breath.   Cardiovascular:  Negative for chest pain and palpitations.     Physical Exam Triage Vital Signs ED Triage Vitals  Encounter Vitals Group     BP      Systolic BP Percentile      Diastolic BP Percentile      Pulse      Resp      Temp      Temp src      SpO2      Weight      Height      Head Circumference      Peak Flow      Pain Score      Pain Loc      Pain Education      Exclude from Growth Chart    No data found.  Updated Vital Signs BP 138/79 (BP Location: Right Arm)   Pulse 75   Temp 98.1 F (36.7 C)   Resp 16   Wt 149 lb (67.6 kg)   SpO2 95%   BMI 25.58 kg/m   Visual Acuity Right Eye Distance:   Left Eye Distance:   Bilateral Distance:    Right Eye Near:   Left Eye Near:    Bilateral Near:     Physical Exam Vitals and nursing note reviewed.  Constitutional:      General: She is not in acute distress.    Appearance: She is well-developed.  HENT:     Right Ear: Tympanic membrane normal.     Left Ear: Tympanic membrane normal.     Nose: Congestion present.     Mouth/Throat:     Mouth: Mucous membranes are moist.     Pharynx: Oropharynx is clear.  Cardiovascular:     Rate and Rhythm: Normal rate and regular rhythm.     Heart sounds: Normal heart sounds.  Pulmonary:     Effort: Pulmonary effort is normal. No respiratory distress.     Breath sounds: Normal breath sounds.  Musculoskeletal:     Cervical back: Neck supple.  Skin:    General: Skin is warm and dry.  Neurological:     Mental Status: She is alert.  UC Treatments / Results  Labs (all labs ordered are listed, but only abnormal results are displayed) Labs Reviewed - No data to  display  EKG   Radiology No results found.  Procedures Procedures (including critical care time)  Medications Ordered in UC Medications - No data to display  Initial Impression / Assessment and Plan / UC Course  I have reviewed the triage vital signs and the nursing notes.  Pertinent labs & imaging results that were available during my care of the patient were reviewed by me and considered in my medical decision making (see chart for details).    Acute sinusitis.  Afebrile, VSS.  Patient has been symptomatic for 10 days.  Treating with amoxicillin; patient states she is not allergic to this medication and has taken it in the past without difficulty.  Discussed symptomatic treatment including Tylenol as needed, plain Mucinex as needed.  Instructed patient to follow up with her PCP if her symptoms are not improving.  She agrees to plan of care.   Final Clinical Impressions(s) / UC Diagnoses   Final diagnoses:  Acute non-recurrent maxillary sinusitis     Discharge Instructions      Take the amoxicillin as directed.  Follow-up with your primary care provider if your symptoms are not improving.      ED Prescriptions     Medication Sig Dispense Auth. Provider   amoxicillin (AMOXIL) 875 MG tablet Take 1 tablet (875 mg total) by mouth 2 (two) times daily for 10 days. 20 tablet Mickie Bail, NP      PDMP not reviewed this encounter.   Mickie Bail, NP 03/29/23 1314

## 2023-03-29 NOTE — ED Triage Notes (Signed)
Patient in office today c/o head congestion, cough x 10d   OTC: flonase  Denies: fever, Ha ,n/v

## 2023-04-02 ENCOUNTER — Telehealth: Payer: Self-pay | Admitting: Emergency Medicine

## 2023-04-02 MED ORDER — AZITHROMYCIN 250 MG PO TABS: 250.0000 mg | ORAL_TABLET | Freq: Every day | ORAL | 0 refills | Status: AC

## 2023-04-02 NOTE — Telephone Encounter (Signed)
Patient called urgent care to report that she has had throat burning when taking her evening dose of amoxicillin for the past 2 nights.  She states it "felt like a bee sting."  She took Benadryl for this.  Instructed her to discontinue the amoxicillin.  Zithromax sent to pharmacy.  Instructed her to follow-up with her primary care provider.  ED precautions given.  She agrees to plan of care.

## 2023-04-26 ENCOUNTER — Ambulatory Visit
Admission: RE | Admit: 2023-04-26 | Discharge: 2023-04-26 | Disposition: A | Discharge status: Home / Self Care | Payer: Medicare Other | Source: Ambulatory Visit | Attending: Emergency Medicine | Admitting: Emergency Medicine

## 2023-04-26 VITALS — BP 136/79 | HR 67 | Temp 98.2°F | Resp 18

## 2023-04-26 DIAGNOSIS — R21 Rash and other nonspecific skin eruption: Secondary | ICD-10-CM

## 2023-04-26 MED ORDER — TRIAMCINOLONE ACETONIDE 0.1 % EX CREA
1.0000 | TOPICAL_CREAM | Freq: Two times a day (BID) | CUTANEOUS | 0 refills | Status: AC
Start: 2023-04-26 — End: ?

## 2023-04-26 NOTE — ED Triage Notes (Signed)
Triage completed by provider.

## 2023-04-26 NOTE — Discharge Instructions (Addendum)
Use the triamcinolone cream as directed.  Take Zyrtec as directed.  Follow up with your primary care provider if your symptoms are not improving.

## 2023-04-26 NOTE — ED Provider Notes (Signed)
Emily Ortega    CSN: [id number] Arrival date & time: 04/26/23  1024      History   Chief Complaint Chief Complaint  Patient presents with   Rash    Entered by patient    HPI Emily Ortega is a 77 y.o. female.  Patient presents with 1 week history of pruritic rash on both antecubital spaces.  Treatment attempted with anti-itch cream, antifungal cream, and calamine lotion without relief.  No new products, medications, foods.  No fever, difficulty swallowing, shortness of breath, or other symptoms.    The history is provided by the patient and medical records.    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   OSA (obstructive sleep apnea) 07/04/2018   Osteopenia 07/04/2018   Type 2 diabetes mellitus without complication, without long-term current use of insulin (HCC) 07/04/2018   Vitamin D insufficiency 07/04/2018   Personal history of breast cancer 03/15/2018   DOE (dyspnea on exertion) 07/20/2017   Essential (primary) hypertension 07/20/2017   Murmur 07/20/2017    Past Surgical History:  Procedure Laterality Date   BACK SURGERY     BREAST SURGERY     DILATION AND CURETTAGE, DIAGNOSTIC / THERAPEUTIC     HAND SURGERY     SHOULDER SURGERY     TONSILLECTOMY      OB History     Gravida  2   Para  2   Term  2   Preterm      AB      Living  1      SAB      IAB      Ectopic      Multiple      Live Births  1        Obstetric Comments  Died at age 81 of pneumonia          Home Medications    Prior to Admission medications   Medication Sig Start Date End Date Taking? Authorizing  Provider  triamcinolone cream (KENALOG) 0.1 % Apply 1 Application topically 2 (two) times daily. 04/26/23  Yes Mickie Bail, NP  azithromycin (ZITHROMAX) 250 MG tablet Take 1 tablet (250 mg total) by mouth daily. Take first 2 tablets together, then 1 every day until finished. 04/02/23   Mickie Bail, NP  cholecalciferol (VITAMIN D3) 10 MCG (400 UNIT) TABS tablet Take 2,000 Units by mouth.    [provider]  esomeprazole (NEXIUM) 40 MG capsule Take 40 mg by mouth daily at 12 noon.    [provider]  glipiZIDE (GLUCOTROL) 10 MG tablet Take 10 mg by mouth daily before breakfast.    [provider]  hydrochlorothiazide (MICROZIDE) 12.5 MG capsule Take 12.5 mg by mouth daily.    [provider]  Loratadine (CLARITIN) 10 MG CAPS Take 1 capsule by mouth daily as needed.    [provider]  metFORMIN (GLUCOPHAGE-XR) 500 MG 24 hr tablet Take 500 mg by mouth daily with breakfast.    [provider]  naproxen (NAPROSYN) 500 MG tablet Take 1 tablet (500 mg total) by mouth 2 (two) times daily with a  meal. 04/30/20   Jene Every, MD  sertraline (ZOLOFT) 50 MG tablet Take 50 mg by mouth daily.    [provider]  simvastatin (ZOCOR) 40 MG tablet Take 40 mg by mouth daily.    [provider]  telmisartan (MICARDIS) 80 MG tablet Take 80 mg by mouth daily.    [provider]  traMADol (ULTRAM) 50 MG tablet Take 1 tablet (50 mg total) by mouth every 6 (six) hours as needed. 06/12/18   Tommi Rumps, PA-C    Family History Family History  Problem Relation Age of Onset   Breast cancer Mother 69   Heart disease Maternal Grandmother    Heart disease Brother    Diabetes Brother    Heart disease Sister    Diabetes Sister     Social History Social History   Tobacco Use   Smoking status: Never   Smokeless tobacco: Never  Vaping Use   Vaping status: Never Used  Substance Use Topics   Alcohol use: Not Currently   Drug  use: Never     Allergies   Amoxicillin, Amoxicillin-pot clavulanate, Avelox [moxifloxacin hcl in nacl], Erythromycin, and Other   Review of Systems Review of Systems  Constitutional:  Negative for chills and fever.  HENT:  Negative for sore throat and trouble swallowing.   Respiratory:  Negative for cough and shortness of breath.   Skin:  Positive for color change and rash.     Physical Exam Triage Vital Signs ED Triage Vitals [04/26/23 1103]  Encounter Vitals Group     BP 136/79     Systolic BP Percentile      Diastolic BP Percentile      Pulse Rate 67     Resp 18     Temp 98.2 F (36.8 C)     Temp src      SpO2 97 %     Weight      Height      Head Circumference      Peak Flow      Pain Score      Pain Loc      Pain Education      Exclude from Growth Chart    No data found.  Updated Vital Signs BP 136/79   Pulse 67   Temp 98.2 F (36.8 C)   Resp 18   SpO2 97%   Visual Acuity Right Eye Distance:   Left Eye Distance:   Bilateral Distance:    Right Eye Near:   Left Eye Near:    Bilateral Near:     Physical Exam Constitutional:      General: She is not in acute distress. HENT:     Mouth/Throat:     Mouth: Mucous membranes are moist.  Cardiovascular:     Rate and Rhythm: Normal rate and regular rhythm.  Pulmonary:     Effort: Pulmonary effort is normal. No respiratory distress.  Skin:    General: Skin is warm and dry.     Findings: Rash present.     Comments: Lightly erythematous patchy papular rash on bilateral antecubital spaces.  Neurological:     Mental Status: She is alert.      UC Treatments / Results  Labs (all labs ordered are listed, but only abnormal results are displayed) Labs Reviewed - No data to display  EKG   Radiology No results found.  Procedures Procedures (including critical care time)  Medications Ordered in UC Medications - No data to  display  Initial Impression / Assessment and Plan / UC Course  I have  reviewed the triage vital signs and the nursing notes.  Pertinent labs & imaging results that were available during my care of the patient were reviewed by me and considered in my medical decision making (see chart for details).    Rash.  The rash appears to be possible contact dermatitis or eczema.  Treating with triamcinolone cream.  Also discussed Zyrtec nightly at bedtime x 2 weeks.  Education provided on adult rash.  Instructed patient to follow-up with her PCP if she is not improving.  She agrees to plan of care.  Final Clinical Impressions(s) / UC Diagnoses   Final diagnoses:  Rash     Discharge Instructions      Use the triamcinolone cream as directed.  Take Zyrtec as directed.  Follow-up with your primary care provider if your symptoms are not improving.      ED Prescriptions     Medication Sig Dispense Auth. Provider   triamcinolone cream (KENALOG) 0.1 % Apply 1 Application topically 2 (two) times daily. 30 g Mickie Bail, NP      PDMP not reviewed this encounter.   Mickie Bail, NP 04/26/23 1140

## 2024-06-23 ENCOUNTER — Encounter: Payer: Self-pay | Admitting: Emergency Medicine

## 2024-06-23 ENCOUNTER — Other Ambulatory Visit: Payer: Self-pay

## 2024-06-23 ENCOUNTER — Emergency Department
Admission: EM | Admit: 2024-06-23 | Discharge: 2024-06-23 | Disposition: A | Discharge status: Home / Self Care | Attending: Emergency Medicine | Admitting: Emergency Medicine

## 2024-06-23 DIAGNOSIS — R1011 Right upper quadrant pain: Secondary | ICD-10-CM

## 2024-06-23 DIAGNOSIS — K8020 Calculus of gallbladder without cholecystitis without obstruction: Secondary | ICD-10-CM | POA: Insufficient documentation

## 2024-06-23 LAB — CBC
HCT: 41.2 % (ref 36.0–46.0)
Hemoglobin: 12.9 g/dL (ref 12.0–15.0)
MCH: 27.6 pg (ref 26.0–34.0)
MCHC: 31.3 g/dL (ref 30.0–36.0)
MCV: 88.2 fL (ref 80.0–100.0)
Platelets: 223 K/uL (ref 150–400)
RBC: 4.67 MIL/uL (ref 3.87–5.11)
RDW: 13.1 % (ref 11.5–15.5)
WBC: 10.5 K/uL (ref 4.0–10.5)
nRBC: 0 % (ref 0.0–0.2)

## 2024-06-23 LAB — URINALYSIS, ROUTINE W REFLEX MICROSCOPIC
Bacteria, UA: NONE SEEN
Bilirubin Urine: NEGATIVE
Glucose, UA: NEGATIVE mg/dL
Hgb urine dipstick: NEGATIVE
Ketones, ur: NEGATIVE mg/dL
Nitrite: NEGATIVE
Protein, ur: 30 mg/dL — AB
Specific Gravity, Urine: 1.032 — ABNORMAL HIGH (ref 1.005–1.030)
pH: 5 (ref 5.0–8.0)

## 2024-06-23 LAB — COMPREHENSIVE METABOLIC PANEL WITH GFR
ALT: 15 U/L (ref 0–44)
AST: 18 U/L (ref 15–41)
Albumin: 4.3 g/dL (ref 3.5–5.0)
Alkaline Phosphatase: 87 U/L (ref 38–126)
Anion gap: 15 (ref 5–15)
BUN: 25 mg/dL — ABNORMAL HIGH (ref 8–23)
CO2: 23 mmol/L (ref 22–32)
Calcium: 10.2 mg/dL (ref 8.9–10.3)
Chloride: 101 mmol/L (ref 98–111)
Creatinine, Ser: 1.11 mg/dL — ABNORMAL HIGH (ref 0.44–1.00)
GFR, Estimated: 51 mL/min — ABNORMAL LOW
Glucose, Bld: 195 mg/dL — ABNORMAL HIGH (ref 70–99)
Potassium: 4.6 mmol/L (ref 3.5–5.1)
Sodium: 139 mmol/L (ref 135–145)
Total Bilirubin: 0.6 mg/dL (ref 0.0–1.2)
Total Protein: 8 g/dL (ref 6.5–8.1)

## 2024-06-23 LAB — LIPASE, BLOOD: Lipase: 145 U/L — ABNORMAL HIGH (ref 11–51)

## 2024-06-23 MED ORDER — DICYCLOMINE HCL 10 MG PO CAPS: 10.0000 mg | ORAL_CAPSULE | Freq: Three times a day (TID) | ORAL | 0 refills | Status: AC

## 2024-06-23 NOTE — ED Notes (Addendum)
 Lab called to inquire about blood work not being in process. Lab reporting blood work received and will process it

## 2024-06-23 NOTE — ED Triage Notes (Signed)
 Pt via POV from UC. Pt c/o RUQ pain for the past week. Tender upon palpation per UC. Pt still has her gallbladder. Denies any NVD. Pt is A&Ox4 and NAD, ambulatory to triage with steady gait.

## 2024-06-23 NOTE — ED Notes (Signed)
 Pt given DC instructions. Pt verbalized understanding of medication and follow up care. Pt ambulatory from ED without difficulty. NAD noted at time of departure.

## 2024-06-23 NOTE — ED Provider Notes (Signed)
 "  [facility] Provider Note   Event Date/Time   First MD Initiated Contact with Patient 06/23/24 1241     (approximate) History  Abdominal Pain  HPI Emily Ortega is a 78 y.o. female with a past medical history of GERD who presents complaining of of right upper quadrant and epigastric abdominal pain that has been present intermittently over the past few months however worsening over the past week.  Patient was tender to palpation in the right upper quadrant at urgent care and was sent to the emergency department for further evaluation.  Patient denies any nausea/vomiting/diarrhea.  Patient denies any recent travel, sick contacts, or food out of the ordinary.  Patient states that she takes Nexium for reflux ROS: Patient currently denies any vision changes, tinnitus, difficulty speaking, facial droop, sore throat, chest pain, shortness of breath, nausea/vomiting/diarrhea, dysuria, or weakness/numbness/paresthesias in any extremity   Physical Exam  Triage Vital Signs: ED Triage Vitals  Encounter Vitals Group     BP 06/23/24 1216 110/61     Girls Systolic BP Percentile --      Girls Diastolic BP Percentile --      Boys Systolic BP Percentile --      Boys Diastolic BP Percentile --      Pulse Rate 06/23/24 1216 85     Resp 06/23/24 1216 18     Temp 06/23/24 1216 98.2 F (36.8 C)     Temp Source 06/23/24 1216 Oral     SpO2 06/23/24 1216 97 %     Weight 06/23/24 1214 145 lb (65.8 kg)     Height 06/23/24 1214 5' 5 (1.651 m)     Head Circumference --      Peak Flow --      Pain Score 06/23/24 1214 5     Pain Loc --      Pain Education --      Exclude from Growth Chart --    Most recent vital signs: Vitals:   06/23/24 1340 06/23/24 1430  BP:  107/66  Pulse:  78  Resp:  15  Temp:  98 F (36.7 C)  SpO2: 97% 95%   General: Awake, oriented x4. CV:  Good peripheral perfusion. Resp:  Normal effort. Abd:  No distention.  Nontender to  palpation Other:  Elderly the well-developed, well-nourished Caucasian female resting comfortably in no acute distress ED Results / Procedures / Treatments  Labs (all labs ordered are listed, but only abnormal results are displayed) Labs Reviewed  LIPASE, BLOOD - Abnormal; Notable for the following components:      Result Value   Lipase 145 (*)    All other components within normal limits  COMPREHENSIVE METABOLIC PANEL WITH GFR - Abnormal; Notable for the following components:   Glucose, Bld 195 (*)    BUN 25 (*)    Creatinine, Ser 1.11 (*)    GFR, Estimated 51 (*)    All other components within normal limits  URINALYSIS, ROUTINE W REFLEX MICROSCOPIC - Abnormal; Notable for the following components:   Color, Urine AMBER (*)    APPearance HAZY (*)    Specific Gravity, Urine 1.032 (*)    Protein, ur 30 (*)    Leukocytes,Ua SMALL (*)    All other components within normal limits  CBC   PROCEDURES: Critical Care performed: No Procedures MEDICATIONS ORDERED IN ED: Medications - No data to display IMPRESSION / MDM / ASSESSMENT AND PLAN / ED COURSE  I reviewed the triage  vital signs and the nursing notes.                             The patient is on the cardiac monitor to evaluate for evidence of arrhythmia and/or significant heart rate changes. Patient's presentation is most consistent with acute presentation with potential threat to life or bodily function. Patient is a 78 year old female with the above-stated past medical history who presents after right upper quadrant tenderness at urgent care with complaints of right upper quadrant abdominal pain DDx: Cholelithiasis, cholecystitis, ascending cholangitis, gastroenteritis, GERD Plan: CBC, CMP, lipase, UA  Laboratory evaluation positive for mildly elevated lipase at 145.  Patient is p.o. tolerant with pain well-controlled without any intervention at this time.  I discussed with patient the possibility of cholelithiasis given the  elevated lipase however the fact that she does not have elevated bilirubin or transaminitis would mean that this is likely nothing surgical.  Patient agreed with plan for discharge at this time with outpatient follow-up for right upper quadrant ultrasound and further evaluation of this abdominal pain as needed.  Patient inquired about the need for possible cholecystectomy if she does have gallstones and was given contact information for our local general surgery team.  Dispo: Discharge home with PCP follow-up   FINAL CLINICAL IMPRESSION(S) / ED DIAGNOSES   Final diagnoses:  Right upper quadrant abdominal pain  Calculus of gallbladder without cholecystitis without obstruction   Rx / DC Orders   ED Discharge Orders          Ordered    dicyclomine  (BENTYL ) 10 MG capsule  3 times daily before meals & bedtime        06/23/24 1427           Note:  This document was prepared using Dragon voice recognition software and may include unintentional dictation errors.   Evelyn Moch K, MD 06/24/24 1409  "
